# Patient Record
Sex: Female | Born: 1985 | Hispanic: No | Marital: Single | Smoking: Current every day smoker
Health system: Southern US, Community
[De-identification: ages and names within clinical notes are randomized; demographics above are authoritative.]

## PROBLEM LIST (undated history)

## (undated) DIAGNOSIS — E669 Obesity, unspecified: Secondary | ICD-10-CM

## (undated) DIAGNOSIS — Z8619 Personal history of other infectious and parasitic diseases: Secondary | ICD-10-CM

## (undated) DIAGNOSIS — F17200 Nicotine dependence, unspecified, uncomplicated: Secondary | ICD-10-CM

## (undated) DIAGNOSIS — A084 Viral intestinal infection, unspecified: Secondary | ICD-10-CM

## (undated) DIAGNOSIS — E66811 Obesity, class 1: Secondary | ICD-10-CM

## (undated) HISTORY — DX: Viral intestinal infection, unspecified: A08.4

## (undated) HISTORY — DX: Nicotine dependence, unspecified, uncomplicated: F17.200

## (undated) HISTORY — DX: Obesity, unspecified: E66.9

## (undated) HISTORY — DX: Obesity, class 1: E66.811

## (undated) HISTORY — DX: Personal history of other infectious and parasitic diseases: Z86.19

## (undated) HISTORY — PX: NO PAST SURGERIES: SHX2092

---

## 2005-01-21 ENCOUNTER — Other Ambulatory Visit: Admission: RE | Admit: 2005-01-21 | Discharge: 2005-01-21 | Payer: Self-pay | Admitting: Gynecology

## 2011-04-06 LAB — CBC AND DIFFERENTIAL: HEMOGLOBIN: 14.1 g/dL (ref 12.0–16.0)

## 2015-08-24 DIAGNOSIS — A084 Viral intestinal infection, unspecified: Secondary | ICD-10-CM

## 2015-08-24 HISTORY — DX: Viral intestinal infection, unspecified: A08.4

## 2015-09-16 ENCOUNTER — Encounter: Payer: Self-pay | Admitting: Emergency Medicine

## 2015-09-16 ENCOUNTER — Emergency Department: Payer: Managed Care, Other (non HMO)

## 2015-09-16 ENCOUNTER — Emergency Department
Admission: EM | Admit: 2015-09-16 | Discharge: 2015-09-16 | Disposition: A | Discharge status: Home / Self Care | Payer: Managed Care, Other (non HMO) | Attending: Emergency Medicine | Admitting: Emergency Medicine

## 2015-09-16 DIAGNOSIS — Z3202 Encounter for pregnancy test, result negative: Secondary | ICD-10-CM | POA: Diagnosis not present

## 2015-09-16 DIAGNOSIS — K219 Gastro-esophageal reflux disease without esophagitis: Secondary | ICD-10-CM | POA: Insufficient documentation

## 2015-09-16 DIAGNOSIS — K2970 Gastritis, unspecified, without bleeding: Secondary | ICD-10-CM | POA: Insufficient documentation

## 2015-09-16 DIAGNOSIS — R1013 Epigastric pain: Secondary | ICD-10-CM

## 2015-09-16 DIAGNOSIS — F17210 Nicotine dependence, cigarettes, uncomplicated: Secondary | ICD-10-CM | POA: Diagnosis not present

## 2015-09-16 LAB — COMPREHENSIVE METABOLIC PANEL
ALT: 18 U/L (ref 14–54)
ANION GAP: 10 (ref 5–15)
AST: 15 U/L (ref 15–41)
Albumin: 5.3 g/dL — ABNORMAL HIGH (ref 3.5–5.0)
Alkaline Phosphatase: 50 U/L (ref 38–126)
BUN: 8 mg/dL (ref 6–20)
CHLORIDE: 104 mmol/L (ref 101–111)
CO2: 24 mmol/L (ref 22–32)
Calcium: 9.5 mg/dL (ref 8.9–10.3)
Creatinine, Ser: 0.54 mg/dL (ref 0.44–1.00)
GFR calc non Af Amer: >60 mL/min (ref >60)
Glucose, Bld: 137 mg/dL — ABNORMAL HIGH (ref 65–99)
POTASSIUM: 3.5 mmol/L (ref 3.5–5.1)
SODIUM: 138 mmol/L (ref 135–145)
Total Bilirubin: 0.8 mg/dL (ref 0.3–1.2)
Total Protein: 8 g/dL (ref 6.5–8.1)

## 2015-09-16 LAB — URINALYSIS COMPLETE WITH MICROSCOPIC (ARMC ONLY)
Bacteria, UA: NONE SEEN
Bilirubin Urine: NEGATIVE
Glucose, UA: NEGATIVE mg/dL
HGB URINE DIPSTICK: NEGATIVE
Leukocytes, UA: NEGATIVE
Nitrite: NEGATIVE
PH: 6 (ref 5.0–8.0)
PROTEIN: NEGATIVE mg/dL
Specific Gravity, Urine: 1.009 (ref 1.005–1.030)

## 2015-09-16 LAB — CBC
HEMATOCRIT: 37.7 % (ref 35.0–47.0)
HEMOGLOBIN: 12.9 g/dL (ref 12.0–16.0)
MCH: 31.6 pg (ref 26.0–34.0)
MCHC: 34.1 g/dL (ref 32.0–36.0)
MCV: 92.5 fL (ref 80.0–100.0)
Platelets: 283 10*3/uL (ref 150–440)
RBC: 4.08 MIL/uL (ref 3.80–5.20)
RDW: 12.7 % (ref 11.5–14.5)
WBC: 9.2 10*3/uL (ref 3.6–11.0)

## 2015-09-16 LAB — POCT PREGNANCY, URINE: Preg Test, Ur: NEGATIVE

## 2015-09-16 LAB — LIPASE, BLOOD: LIPASE: 23 U/L (ref 11–51)

## 2015-09-16 MED ORDER — SUCRALFATE 1 G PO TABS: 1.0000 g | ORAL_TABLET | Freq: Four times a day (QID) | ORAL | Status: DC

## 2015-09-16 MED ORDER — GI COCKTAIL ~~LOC~~
30.0000 mL | Freq: Once | ORAL | Status: AC
Start: 2015-09-16 — End: 2015-09-16
  Administered 2015-09-16: 30 mL via ORAL
  Filled 2015-09-16 (×2): qty 30

## 2015-09-16 NOTE — ED Notes (Signed)
Pt returned from [facility]

## 2015-09-16 NOTE — ED Notes (Signed)
Patient transported to [facility] 

## 2015-09-16 NOTE — Discharge Instructions (Signed)
Please seek medical attention for any high fevers, chest pain, shortness of breath, change in behavior, persistent vomiting, bloody stool or any other new or concerning symptoms.   Gastritis, Adult Gastritis is soreness and puffiness (inflammation) of the lining of the stomach. If you do not get help, gastritis can cause bleeding and sores (ulcers) in the stomach. HOME CARE   Only take medicine as told by your doctor.  If you were given antibiotic medicines, take them as told. Finish the medicines even if you start to feel better.  Drink enough fluids to keep your pee (urine) clear or pale yellow.  Avoid foods and drinks that make your problems worse. Foods you may want to avoid include:  Caffeine or alcohol.  Chocolate.  Mint.  Garlic and onions.  Spicy foods.  Citrus fruits, including oranges, lemons, or limes.  Food containing tomatoes, including sauce, chili, salsa, and pizza.  Fried and fatty foods.  Eat small meals throughout the day instead of large meals. GET HELP RIGHT AWAY IF:   You have black or dark red poop (stools).  You throw up (vomit) blood. It may look like coffee grounds.  You cannot keep fluids down.  Your belly (abdominal) pain gets worse.  You have a fever.  You do not feel better after 1 week.  You have any other questions or concerns. MAKE SURE YOU:   Understand these instructions.  Will watch your condition.  Will get help right away if you are not doing well or get worse.   This information is not intended to replace advice given to you by your health care provider. Make sure you discuss any questions you have with your health care provider.   Document Released: 01/26/2008 Document Revised: 11/01/2011 Document Reviewed: 09/22/2011 Elsevier Interactive Patient Education 2016 [employer]. Food Choices for Gastroesophageal Reflux Disease, Adult When you have gastroesophageal reflux disease (GERD), the foods you eat and your eating  habits are very important. Choosing the right foods can help ease the discomfort of GERD. WHAT GENERAL GUIDELINES DO I NEED TO FOLLOW?  Choose fruits, vegetables, whole grains, low-fat dairy products, and low-fat meat, fish, and poultry.  Limit fats such as oils, salad dressings, butter, nuts, and avocado.  Keep a food diary to identify foods that cause symptoms.  Avoid foods that cause reflux. These may be different for different people.  Eat frequent small meals instead of three large meals each day.  Eat your meals slowly, in a relaxed setting.  Limit fried foods.  Cook foods using methods other than frying.  Avoid drinking alcohol.  Avoid drinking large amounts of liquids with your meals.  Avoid bending over or lying down until 2-3 hours after eating. WHAT FOODS ARE NOT RECOMMENDED? The following are some foods and drinks that may worsen your symptoms: Vegetables Tomatoes. Tomato juice. Tomato and spaghetti sauce. Chili peppers. Onion and garlic. Horseradish. Fruits Oranges, grapefruit, and lemon (fruit and juice). Meats High-fat meats, fish, and poultry. This includes hot dogs, ribs, ham, sausage, salami, and bacon. Dairy Whole milk and chocolate milk. Sour cream. Cream. Butter. Ice cream. Cream cheese.  Beverages Coffee and tea, with or without caffeine. Carbonated beverages or energy drinks. Condiments Hot sauce. Barbecue sauce.  Sweets/Desserts Chocolate and cocoa. Donuts. Peppermint and spearmint. Fats and Oils High-fat foods, including [place] fries and potato chips. Other Vinegar. Strong spices, such as black pepper, white pepper, red pepper, cayenne, curry powder, cloves, ginger, and chili powder. The items listed above may not be  a complete list of foods and beverages to avoid. Contact your dietitian for more information.   This information is not intended to replace advice given to you by your health care provider. Make sure you discuss any questions you  have with your health care provider.   Document Released: 08/09/2005 Document Revised: 08/30/2014 Document Reviewed: 06/13/2013 Elsevier Interactive Patient Education [employer]

## 2015-09-16 NOTE — ED Notes (Signed)
C/o upper abdominal pain.  Onset of symptoms 09/07/15.  Also c/o intermittent episodes of constipation and diarrhea.

## 2015-09-16 NOTE — ED Provider Notes (Signed)
[facility] Emergency Department Provider Note    ____________________________________________  Time seen: 1820  I have reviewed the triage vital signs and the nursing notes.   HISTORY  Chief Complaint Abdominal Pain   History limited by: Not Limited   HPI Melanie Burton is a 30 y.o. female with no significant past medical history presents to the emergency department today because of concerns for epigastric pain. Patient states that the pain started 9 days ago. She states she has had the pain every day since then. Is fairly constant. It does somewhat wax and wane. She says it is slightly worse after eating. She has not had any associated nausea or vomiting. No associated fever. She states that she went to an urgent care and was told she had GERD. She has been taking Zantac for the past 4 days. This is not provided any relief. She denies any similar symptoms in the past.     History reviewed. No pertinent past medical history.  There are no active problems to display for this patient.   History reviewed. No pertinent past surgical history.  No current outpatient prescriptions on file.  Allergies Review of patient's allergies indicates no known allergies.  No family history on file.  Social History Social History  Substance Use Topics  . Smoking status: Current Every Day Smoker -- 0.50 packs/day    Types: Cigarettes  . Smokeless tobacco: Never Used  . Alcohol Use: No    Review of Systems  Constitutional: Negative for fever. Cardiovascular: Negative for chest pain. Respiratory: Negative for shortness of breath. Gastrointestinal: Positive for epigastric pain Neurological: Negative for headaches, focal weakness or numbness.   10-point ROS otherwise negative.  ____________________________________________   PHYSICAL EXAM:  VITAL SIGNS: ED Triage Vitals  Enc Vitals Group     BP 09/16/15 1716 141/75 mmHg     Pulse Rate 09/16/15 1716  92     Resp 09/16/15 1716 16     Temp 09/16/15 1716 98.4 F (36.9 C)     Temp Source 09/16/15 1716 Oral     SpO2 09/16/15 1716 100 %     Weight 09/16/15 1716 185 lb (83.915 kg)     Height 09/16/15 1716  (1.626 m)     Head Cir --      Peak Flow --      Pain Score 09/16/15 1716 6   Constitutional: Alert and oriented. Well appearing and in no distress. Eyes: Conjunctivae are normal. PERRL. Normal extraocular movements. ENT   Head: Normocephalic and atraumatic.   Nose: No congestion/rhinnorhea.   Mouth/Throat: Mucous membranes are moist.   Neck: No stridor. Hematological/Lymphatic/Immunilogical: No cervical lymphadenopathy. Cardiovascular: Normal rate, regular rhythm.  No murmurs, rubs, or gallops. Respiratory: Normal respiratory effort without tachypnea nor retractions. Breath sounds are clear and equal bilaterally. No wheezes/rales/rhonchi. Gastrointestinal: Soft and minimally tender to palpation in the epigastric region. Genitourinary: Deferred Musculoskeletal: Normal range of motion in all extremities. No joint effusions.  No lower extremity tenderness nor edema. Neurologic:  Normal speech and language. No gross focal neurologic deficits are appreciated.  Skin:  Skin is warm, dry and intact. No rash noted. Psychiatric: Mood and affect are normal. Speech and behavior are normal. Patient exhibits appropriate insight and judgment.  ____________________________________________    LABS (pertinent positives/negatives)  Labs Reviewed  COMPREHENSIVE METABOLIC PANEL - Abnormal; Notable for the following:    Glucose, Bld 137 (*)    Albumin 5.3 (*)    All other components within normal  limits  URINALYSIS COMPLETEWITH MICROSCOPIC ([facility] ONLY) - Abnormal; Notable for the following:    Color, Urine STRAW (*)    APPearance CLEAR (*)    Ketones, ur 1+ (*)    Squamous Epithelial / LPF 0-5 (*)    All other components within normal limits  LIPASE, BLOOD  CBC  POC URINE  PREG, ED  POCT PREGNANCY, URINE     ____________________________________________   EKG  None  ____________________________________________    RADIOLOGY  [place] RUQ IMPRESSION: No cholelithiasis or sonographic evidence for acute cholecystitis.  Hepatic steatosis.  ____________________________________________   PROCEDURES  Procedure(s) performed: None  Critical Care performed: No  ____________________________________________   INITIAL IMPRESSION / ASSESSMENT AND PLAN / ED COURSE  Pertinent labs & imaging results that were available during my care of the patient were reviewed by me and considered in my medical decision making (see chart for details).  Patient presented to the emergency department today because of concerns for epigastric and upper abdominal pain. Blood work and right upper quadrant ultrasound without concerning findings. The patient was given a GI cocktail which she stated completely resolved the pain. The splinter for gastritis likely. Patient is already started Zantac so we will add on sucralfate.  ____________________________________________   FINAL CLINICAL IMPRESSION(S) / ED DIAGNOSES  Final diagnoses:  Epigastric abdominal pain  Gastritis     Phineas Semen, MD 09/16/15 2012

## 2015-12-18 ENCOUNTER — Encounter: Payer: Self-pay | Admitting: Family Medicine

## 2015-12-18 ENCOUNTER — Ambulatory Visit (INDEPENDENT_AMBULATORY_CARE_PROVIDER_SITE_OTHER): Payer: Managed Care, Other (non HMO) | Admitting: Family Medicine

## 2015-12-18 ENCOUNTER — Encounter (INDEPENDENT_AMBULATORY_CARE_PROVIDER_SITE_OTHER): Payer: Self-pay

## 2015-12-18 VITALS — BP 116/82 | HR 80 | Temp 99.0°F | Ht 64.0 in | Wt 190.5 lb

## 2015-12-18 DIAGNOSIS — Z720 Tobacco use: Secondary | ICD-10-CM | POA: Diagnosis not present

## 2015-12-18 DIAGNOSIS — F17200 Nicotine dependence, unspecified, uncomplicated: Secondary | ICD-10-CM

## 2015-12-18 DIAGNOSIS — E669 Obesity, unspecified: Secondary | ICD-10-CM | POA: Diagnosis not present

## 2015-12-18 DIAGNOSIS — E66811 Obesity, class 1: Secondary | ICD-10-CM | POA: Insufficient documentation

## 2015-12-18 NOTE — Progress Notes (Signed)
Pre visit review using our clinic review tool, if applicable. No additional management support is needed unless otherwise documented below in the visit note. 

## 2015-12-18 NOTE — Assessment & Plan Note (Signed)
Discussed healthy lifestyle changes to affect sustainable weight loss.  

## 2015-12-18 NOTE — Progress Notes (Signed)
BP 116/82 mmHg  Pulse 80  Temp(Src) 99 F (37.2 C) (Oral)  Ht  (1.626 m)  Wt 190 lb 8 oz (86.41 kg)  BMI 32.68 kg/m2  LMP 12/11/2015   CC: new pt to establish care  Subjective:    Patient ID: Lorenza Burton, female    DOB: 1986-07-12, 30 y.o.   MRN: [id number]  HPI: Audreana Hancox is a 30 y.o. female presenting on 12/18/2015 for Establish Care   Prior saw [facility].   Seen at ER 08/2015 with 1 wk epigastric pain dx viral gastroenteritis. Treated with GI cocktail and carafate. Also started zantac daily.   Smoker - 1/4 ppd. Trying to quit slowly. Obesity - clogs once weekly, walks around home.   Preventative: Declines CPE today. Well woman exam - 1-2 yrs ago with PCP. Tdap within last 5 yrs - will check at work.  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 Interim medical history since our last visit reviewed. Allergies and medications reviewed and updated. No current outpatient prescriptions on file prior to visit.   No current facility-administered medications on file prior to visit.    Review of Systems Per HPI unless specifically indicated in ROS section     Objective:    BP 116/82 mmHg  Pulse 80  Temp(Src) 99 F (37.2 C) (Oral)  Ht  (1.626 m)  Wt 190 lb 8 oz (86.41 kg)  BMI 32.68 kg/m2  LMP 12/11/2015  Wt Readings from Last 3 Encounters:  12/18/15 190 lb 8 oz (86.41 kg)  09/16/15 185 lb (83.915 kg)    Physical Exam  Constitutional: She appears well-developed and well-nourished. No distress.  HENT:  Mouth/Throat: Oropharynx is clear and moist. No oropharyngeal exudate.  Eyes: Conjunctivae and EOM are normal. Pupils are equal, round, and reactive to light. No scleral icterus.  Neck: No  thyromegaly present.  Cardiovascular: Normal rate, regular rhythm, normal heart sounds and intact distal pulses.   No murmur heard. Pulmonary/Chest: Effort normal and breath sounds normal. No respiratory distress. She has no wheezes. She has no rales.  Musculoskeletal: She exhibits no edema.  Skin: Skin is warm and dry. No rash noted.  Psychiatric: She has a normal mood and affect.  Nursing note and vitals reviewed.  Results for orders placed or performed during the hospital encounter of 09/16/15  Lipase, blood  Result Value Ref Range   Lipase 23 11 - 51 U/L  Comprehensive metabolic panel  Result Value Ref Range   Sodium 138 135 - 145 mmol/L   Potassium 3.5 3.5 - 5.1 mmol/L   Chloride 104 101 - 111 mmol/L   CO2 24 22 - 32 mmol/L   Glucose, Bld 137 (H) 65 - 99 mg/dL   BUN 8 6 - 20 mg/dL   Creatinine, Ser 4.09 0.44 - 1.00 mg/dL   Calcium 9.5 8.9 - 81.1 mg/dL   Total Protein 8.0 6.5 - 8.1 g/dL   Albumin 5.3 (H) 3.5 - 5.0 g/dL   AST 15 15 - 41 U/L   ALT 18 14 - 54 U/L   Alkaline Phosphatase 50 38 - 126 U/L   Total Bilirubin 0.8 0.3 - 1.2 mg/dL   GFR calc non Af Amer >60 >60 mL/min   GFR calc Af Amer >60 >60  mL/min   Anion gap 10 5 - 15  CBC  Result Value Ref Range   WBC 9.2 3.6 - 11.0 K/uL   RBC 4.08 3.80 - 5.20 MIL/uL   Hemoglobin 12.9 12.0 - 16.0 g/dL   HCT 09.837.7 11.935.0 - 14.747.0 %   MCV 92.5 80.0 - 100.0 fL   MCH 31.6 26.0 - 34.0 pg   MCHC 34.1 32.0 - 36.0 g/dL   RDW 82.912.7 56.211.5 - 13.014.5 %   Platelets 283 150 - 440 K/uL  Urinalysis complete, with microscopic ([facility] only)  Result Value Ref Range   Color, Urine STRAW (A) YELLOW   APPearance CLEAR (A) CLEAR   Glucose, UA NEGATIVE NEGATIVE mg/dL   Bilirubin Urine NEGATIVE NEGATIVE   Ketones, ur 1+ (A) NEGATIVE mg/dL   Specific Gravity, Urine 1.009 1.005 - 1.030   Hgb urine dipstick NEGATIVE NEGATIVE   pH 6.0 5.0 - 8.0   Protein, ur NEGATIVE NEGATIVE mg/dL   Nitrite NEGATIVE NEGATIVE   Leukocytes, UA NEGATIVE NEGATIVE   RBC /  HPF 0-5 0 - 5 RBC/hpf   WBC, UA 0-5 0 - 5 WBC/hpf   Bacteria, UA NONE SEEN NONE SEEN   Squamous Epithelial / LPF 0-5 (A) NONE SEEN   Mucous PRESENT   Pregnancy, urine POC  Result Value Ref Range   Preg Test, Ur NEGATIVE NEGATIVE      Assessment & Plan:   Problem List Items Addressed This Visit    Smoker - Primary    Slowly cutting down on her own. Encouraged full smoking cessation.      Obesity, Class I, BMI 30-34.9    Discussed healthy lifestyle changes to affect sustainable weight loss          Follow up plan: Return in about 1 year (around 12/17/2016), or as needed, for annual exam, prior fasting for blood work.  Eustaquio BoydenJavier Gretchen Weinfeld, MD

## 2015-12-18 NOTE — Patient Instructions (Addendum)
Double check at work on last Tdap. You are doing well today Return as needed or in 1 yr for physical.

## 2015-12-18 NOTE — Assessment & Plan Note (Signed)
Slowly cutting down on her own. Encouraged full smoking cessation.

## 2016-01-17 ENCOUNTER — Encounter: Payer: Self-pay | Admitting: Family Medicine

## 2016-07-14 ENCOUNTER — Ambulatory Visit (INDEPENDENT_AMBULATORY_CARE_PROVIDER_SITE_OTHER): Payer: Managed Care, Other (non HMO) | Admitting: Family Medicine

## 2016-07-14 VITALS — BP 110/88 | HR 78 | Temp 98.8°F | Ht 64.0 in | Wt 196.0 lb

## 2016-07-14 DIAGNOSIS — B309 Viral conjunctivitis, unspecified: Secondary | ICD-10-CM | POA: Diagnosis not present

## 2016-07-14 MED ORDER — ERYTHROMYCIN 5 MG/GM OP OINT: 1.0000 "application " | TOPICAL_OINTMENT | Freq: Three times a day (TID) | OPHTHALMIC | 0 refills | Status: AC

## 2016-07-14 NOTE — Progress Notes (Signed)
Subjective:    Patient ID: Melanie Burton, female    DOB: Aug 11, 1986, 30 y.o.   MRN: [id number]  HPI This is a 30 yo female who presents today with 2 days of right eye redness. Yesterday she used Visine and eye continued to get redder and have green drainage. By the end of the day, was more red and felt irritated like sand paper. Eye was matted shut this morning. Has noticed pain on right side of throat today. No visual changes. No nasal drainage, no cough, no ear pain. No fever. She works in a daycare, no recent strep exposure, kids with runny noses, some conjunctivitis last several weeks.   Past Medical History:  Diagnosis Date  . History of chicken pox   . Obesity, Class I, BMI 30-34.9   . Smoker   . Viral gastroenteritis 08/2015   [facility]   Past Surgical History:  Procedure Laterality Date  . NO PAST SURGERIES     Family History  Problem Relation Age of Onset  . Cancer Father [id number]    lung - deceased  . Cancer Paternal Grandfather [id number]    lung  . Cancer Paternal Aunt [id number]    breast  . Arthritis Other     paternal side  . Diabetes Other     maternal side  . CAD Neg Hx   . Stroke Neg Hx    Social History  Substance Use Topics  . Smoking status: Current Every Day Smoker    Packs/day: 0.25    Types: Cigarettes  . Smokeless tobacco: Never Used  . Alcohol use 0.0 oz/week     Comment: rare      Review of Systems Per HPI    Objective:   Physical Exam  Constitutional: She is oriented to person, place, and time. She appears well-developed and well-nourished. No distress.  HENT:  Head: Normocephalic and atraumatic.  Right Ear: External ear normal.  Left Ear: External ear normal.  Mouth/Throat: Oropharynx is clear and moist. No oropharyngeal exudate.  Eyes: EOM are normal. Pupils are equal, round, and reactive to light. Right eye exhibits no discharge. Left eye exhibits no discharge. Right conjunctiva is injected. Right conjunctiva has no hemorrhage. Left  conjunctiva is not injected. Left conjunctiva has no hemorrhage.  Cardiovascular: Normal rate.   Pulmonary/Chest: Effort normal.  Musculoskeletal: Normal range of motion.  Neurological: She is alert and oriented to person, place, and time.  Skin: Skin is warm and dry. She is not diaphoretic.  Psychiatric: She has a normal mood and affect. Her behavior is normal. Judgment and thought content normal.  Vitals reviewed.     BP 110/88   Pulse 78   Temp 98.8 F (37.1 C)   Ht 5\' 4"  (1.626 m)   Wt 196 lb (88.9 kg)   SpO2 98%   BMI 33.64 kg/m  Wt Readings from Last 3 Encounters:  07/14/16 196 lb (88.9 kg)  12/18/15 190 lb 8 oz (86.4 kg)  09/16/15 185 lb (83.9 kg)       Assessment & Plan:  1. Acute viral conjunctivitis of right eye - likely viral, provided written and verbal information regarding symptomatic treatment- suggested liquid tears for lubrication, good hand washing - if not resolved in 48 hours, can start antibiotic  - erythromycin ophthalmic ointment; Place 1 application into the right eye 3 (three) times daily. For 5-7 days  Dispense: 3.5 g; Refill: 0   Olean Reeeborah Piya Mesch, FNP-BC   [facility]  [place]  07/14/2016 10:31 AM

## 2016-07-14 NOTE — Patient Instructions (Addendum)
Viral Conjunctivitis, Adult Viral conjunctivitis is an inflammation of the clear membrane that covers the white part of your eye and the inner surface of your eyelid (conjunctiva). The inflammation is caused by a viral infection. The blood vessels in the conjunctiva become inflamed, causing the eye to become red or pink, and often itchy. Viral conjunctivitis can be easily passed from one person to another (is contagious). This condition is often called pink eye. What are the causes? This condition is caused by a virus. A virus is a type of contagious germ. It can be spread by touching objects that have been contaminated with the virus, such as doorknobs or towels. It can also be passed through droplets, such as from coughing or sneezing. What are the signs or symptoms? Symptoms of this condition include:  Eye redness.  Tearing or watery eyes.  Itchy and irritated eyes.  Burning feeling in the eyes.  Clear drainage from the eye.  Swollen eyelids.  A gritty feeling in the eye.  Light sensitivity. This condition often occurs with other symptoms, such as a fever, nausea, or a rash. How is this diagnosed? This condition is diagnosed with a medical history and physical exam. If you have discharge from your eye, the discharge may be tested to rule out other causes of conjunctivitis. How is this treated? Viral conjunctivitis does not respond to medicines that kill bacteria (antibiotics). Treatment for viral conjunctivitis is directed at stopping a bacterial infection from developing in addition to the viral infection. Treatment also aims to relieve your symptoms, such as itching. This may be done with antihistamine drops or other eye medicines. Rarely, steroid eye drops or antiviral medicines may be prescribed. Follow these instructions at home: Medicines    Take or apply over-the-counter and prescription medicines only as told by your health care provider.  Be very careful to avoid touching  the edge of the eyelid with the eye drop bottle or ointment tube when applying medicines to the affected eye. Being careful this way will stop you from spreading the infection to the other eye or to other people. Eye care   Avoid touching or rubbing your eyes.  Apply a warm, wet, clean washcloth to your eye for 10-20 minutes, 3-4 times per day or as told by your health care provider.  If you wear contact lenses, do not wear them until the inflammation is gone and your health care provider says it is safe to wear them again. Ask your health care provider how to sterilize or replace your contact lenses before using them again. Wear glasses until you can resume wearing contacts.  Avoid wearing eye makeup until the inflammation is gone. Throw away any old eye cosmetics that may be contaminated.  Gently wipe away any drainage from your eye with a warm, wet washcloth or a cotton ball. General instructions   Change or wash your pillowcase every day or as told by your health care provider.  Do not share towels, pillowcases, washcloths, eye makeup, makeup brushes, contact lenses, or glasses. This may spread the infection.  Wash your hands often with soap and water. Use paper towels to dry your hands. If soap and water are not available, use hand sanitizer.  Try to avoid contact with other people for one week or as told by your health care provider. Contact a health care provider if:  Your symptoms do not improve with treatment or they get worse.  You have increased pain.  Your vision becomes blurry.  You   have a fever.  You have facial pain, redness, or swelling.  You have yellow or green drainage coming from your eye.  You have new symptoms. This information is not intended to replace advice given to you by your health care provider. Make sure you discuss any questions you have with your health care provider. Document Released: 10/30/2002 Document Revised: 03/06/2016 Document Reviewed:  02/24/2016 Elsevier Interactive Patient Education  2017 [employer]  

## 2016-08-03 ENCOUNTER — Other Ambulatory Visit: Payer: Self-pay | Admitting: Family Medicine

## 2016-08-03 DIAGNOSIS — B309 Viral conjunctivitis, unspecified: Secondary | ICD-10-CM

## 2017-08-13 IMAGING — US US ABDOMEN LIMITED
1 series · 14 of 25 positions shown · non-contrast
Comparison: None.

CLINICAL DATA: Patient with epigastric abdominal pain.

EXAM:
US ABDOMEN LIMITED - RIGHT UPPER QUADRANT

[Series 1: us abdomen limited · 0.22mm/px · 14 of 68 slices shown]
[im 1/68]
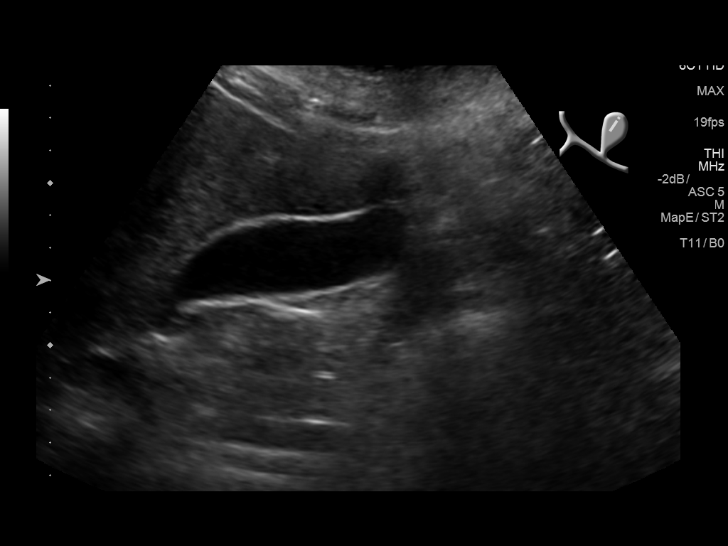
[im 6/68]
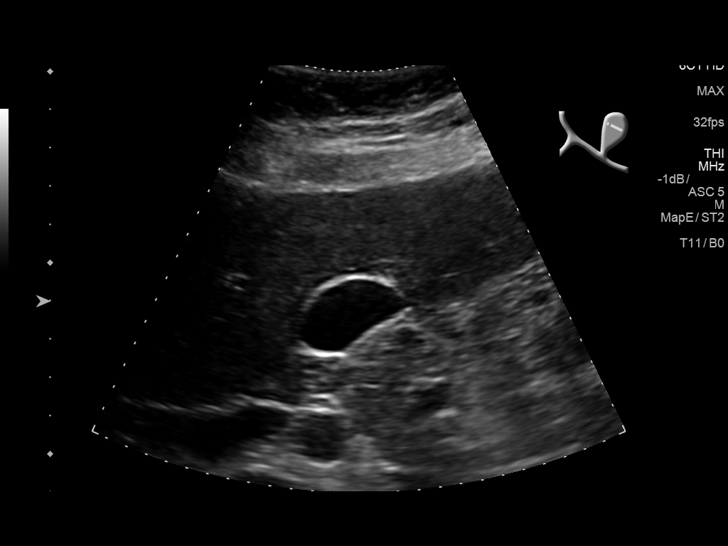
[im 12/68]
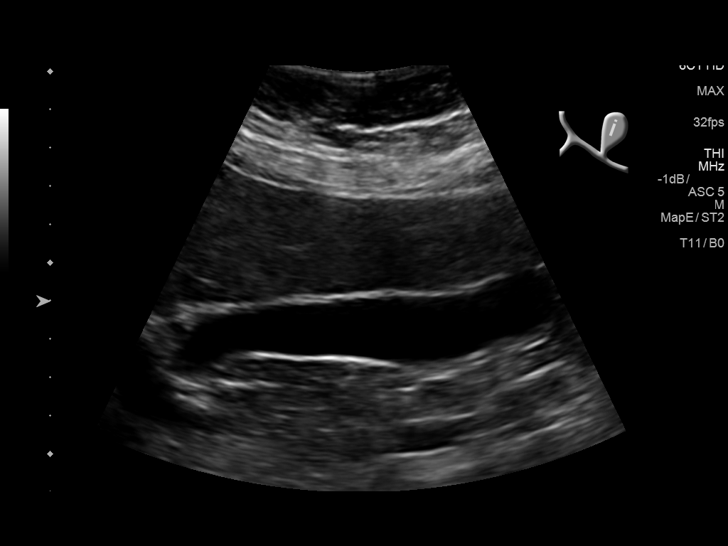
[im 17/68]
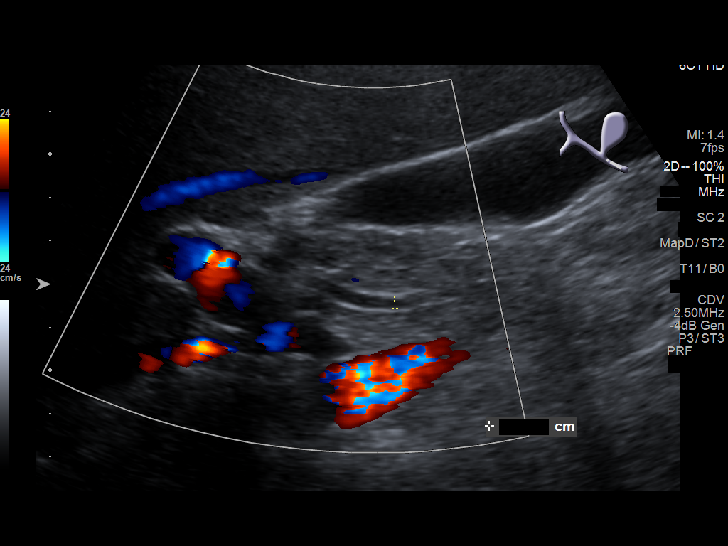
[im 23/68]
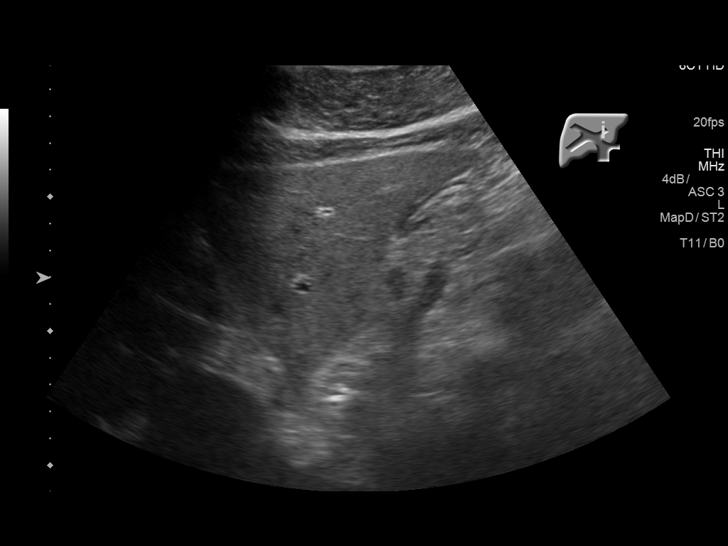
[im 26/68]
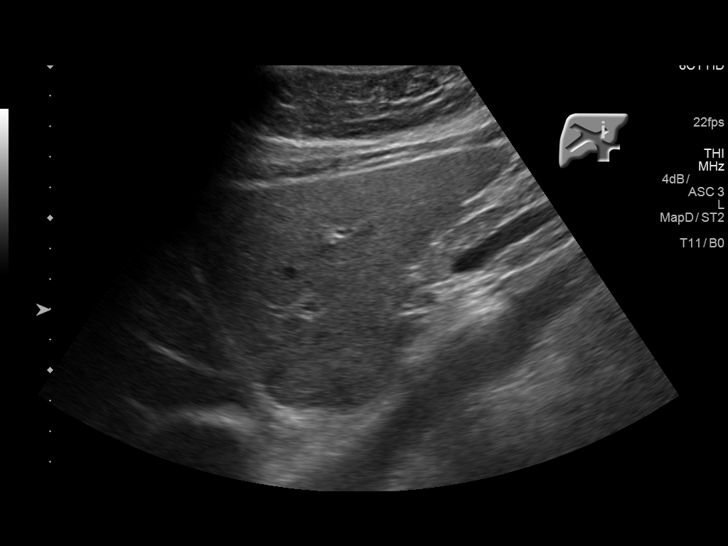
[im 31/68]
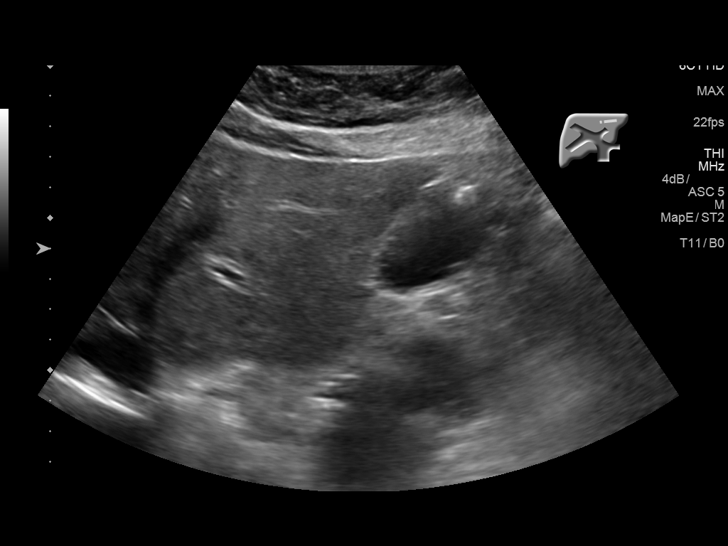
[im 37/68]
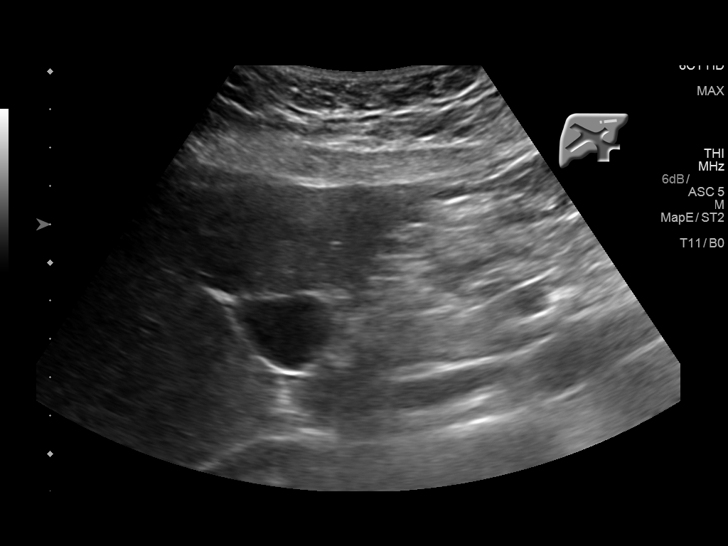
[im 42/68]
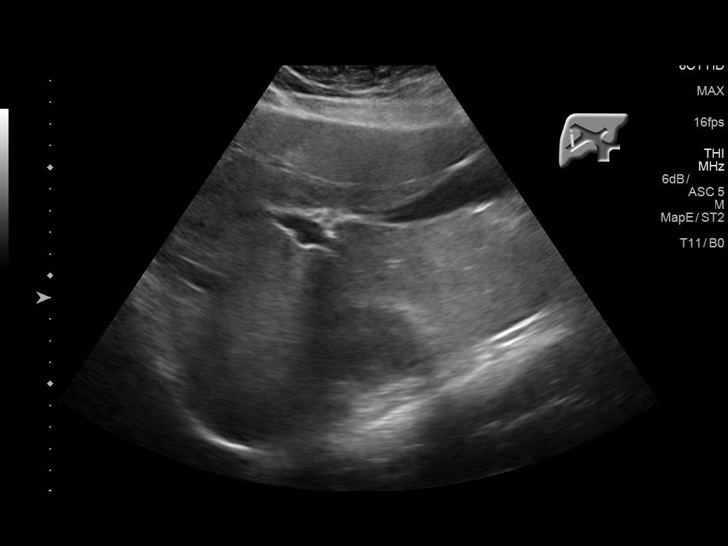
[im 45/68]
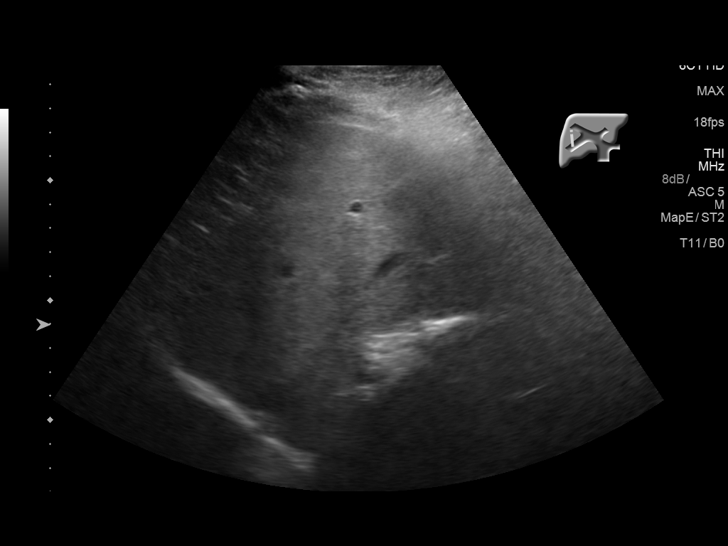
[im 51/68]
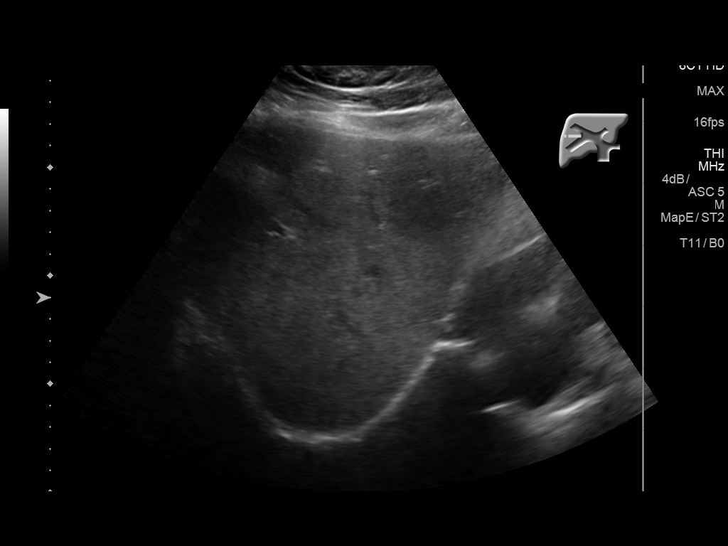
[im 56/68]
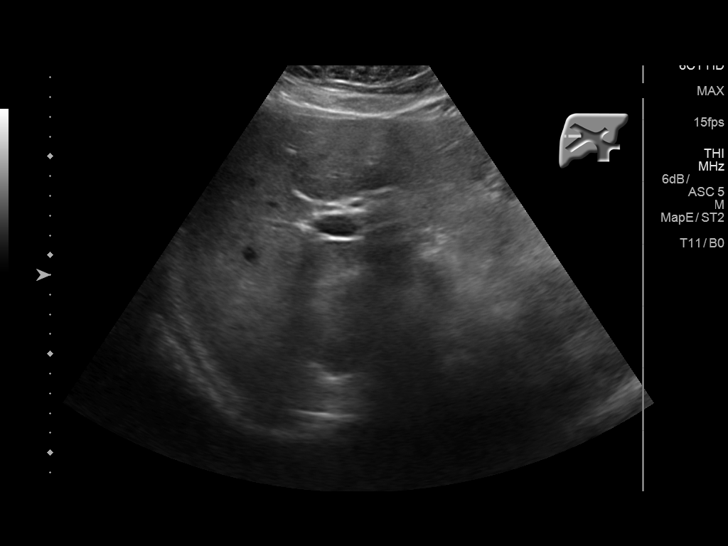
[im 62/68]
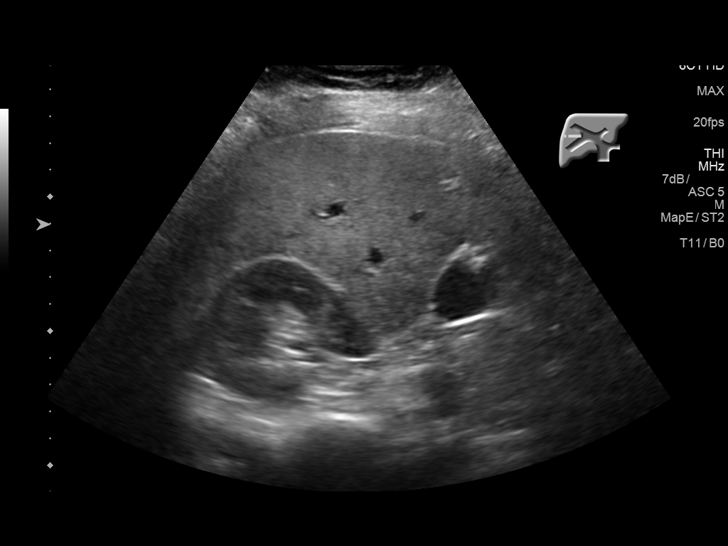
[im 68/68]
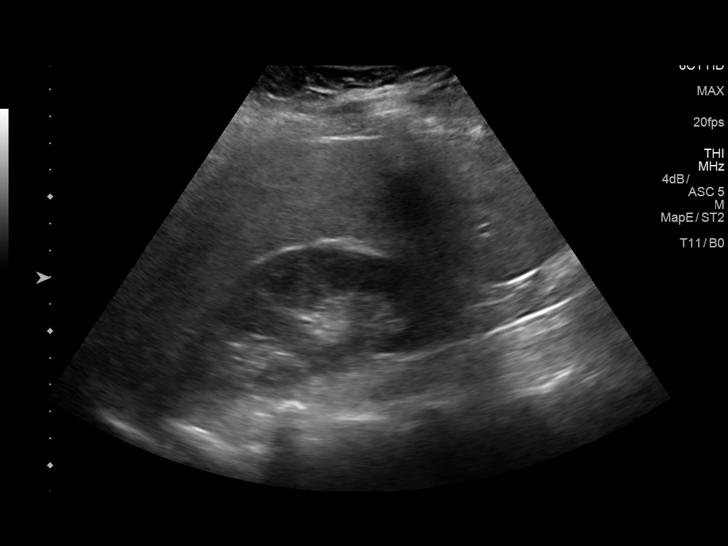

[14 of 25 positions shown; findings below may reference images not displayed]

FINDINGS: Gallbladder:

No gallstones or wall thickening visualized. No sonographic Murphy
sign noted by sonographer.

Common bile duct:

Diameter: 4 mm

Liver:

Diffusely increased in echogenicity.  No focal lesion identified.
IMPRESSION: No cholelithiasis or sonographic evidence for acute cholecystitis.

Hepatic steatosis.

## 2021-03-28 ENCOUNTER — Other Ambulatory Visit: Payer: Self-pay

## 2021-03-28 DIAGNOSIS — Z20822 Contact with and (suspected) exposure to covid-19: Secondary | ICD-10-CM | POA: Diagnosis not present

## 2021-03-28 DIAGNOSIS — N12 Tubulo-interstitial nephritis, not specified as acute or chronic: Secondary | ICD-10-CM | POA: Diagnosis not present

## 2021-03-28 DIAGNOSIS — F17210 Nicotine dependence, cigarettes, uncomplicated: Secondary | ICD-10-CM | POA: Insufficient documentation

## 2021-03-28 DIAGNOSIS — R109 Unspecified abdominal pain: Secondary | ICD-10-CM | POA: Diagnosis present

## 2021-03-28 LAB — BASIC METABOLIC PANEL
Anion gap: 11 (ref 5–15)
BUN: 6 mg/dL (ref 6–20)
CO2: 19 mmol/L — ABNORMAL LOW (ref 22–32)
Calcium: 8.7 mg/dL — ABNORMAL LOW (ref 8.9–10.3)
Chloride: 99 mmol/L (ref 98–111)
Creatinine, Ser: 0.62 mg/dL (ref 0.44–1.00)
GFR, Estimated: >60 mL/min (ref >60)
Glucose, Bld: 126 mg/dL — ABNORMAL HIGH (ref 70–99)
Potassium: 4.1 mmol/L (ref 3.5–5.1)
Sodium: 129 mmol/L — ABNORMAL LOW (ref 135–145)

## 2021-03-28 LAB — URINALYSIS, COMPLETE (UACMP) WITH MICROSCOPIC
Bilirubin Urine: NEGATIVE
Glucose, UA: NEGATIVE mg/dL
Ketones, ur: 20 mg/dL — AB
Nitrite: NEGATIVE
Protein, ur: >=300 mg/dL — AB
Specific Gravity, Urine: 1.029 (ref 1.005–1.030)
WBC, UA: >50 WBC/hpf — ABNORMAL HIGH (ref 0–5)
pH: 5 (ref 5.0–8.0)

## 2021-03-28 LAB — CBC
HCT: 33.4 % — ABNORMAL LOW (ref 36.0–46.0)
Hemoglobin: 12 g/dL (ref 12.0–15.0)
MCH: 32.6 pg (ref 26.0–34.0)
MCHC: 35.9 g/dL (ref 30.0–36.0)
MCV: 90.8 fL (ref 80.0–100.0)
Platelets: 207 10*3/uL (ref 150–400)
RBC: 3.68 MIL/uL — ABNORMAL LOW (ref 3.87–5.11)
RDW: 11.9 % (ref 11.5–15.5)
WBC: 10.9 10*3/uL — ABNORMAL HIGH (ref 4.0–10.5)
nRBC: 0 % (ref 0.0–0.2)

## 2021-03-28 MED ORDER — ACETAMINOPHEN 325 MG PO TABS
650.0000 mg | ORAL_TABLET | Freq: Once | ORAL | Status: AC
  Administered 2021-03-28: 650 mg via ORAL
  Filled 2021-03-28: qty 2

## 2021-03-28 NOTE — ED Triage Notes (Signed)
C/o left sided flank pain x3 days with chills, fever, and decreased appetite. Tylenol at 1200. Intermittent n/v. Denies diarrhea.

## 2021-03-28 NOTE — ED Notes (Addendum)
labs collected in triage

## 2021-03-29 ENCOUNTER — Emergency Department
Admission: EM | Admit: 2021-03-29 | Discharge: 2021-03-29 | Disposition: A | Discharge status: Home / Self Care | Payer: BC Managed Care – PPO | Attending: Emergency Medicine | Admitting: Emergency Medicine

## 2021-03-29 ENCOUNTER — Emergency Department: Payer: BC Managed Care – PPO

## 2021-03-29 DIAGNOSIS — N12 Tubulo-interstitial nephritis, not specified as acute or chronic: Secondary | ICD-10-CM

## 2021-03-29 LAB — PREGNANCY, URINE: Preg Test, Ur: NEGATIVE

## 2021-03-29 LAB — RESP PANEL BY RT-PCR (FLU A&B, COVID) ARPGX2
Influenza A by PCR: NEGATIVE
Influenza B by PCR: NEGATIVE
SARS Coronavirus 2 by RT PCR: POSITIVE — AB

## 2021-03-29 LAB — POC URINE PREG, ED: Preg Test, Ur: NEGATIVE

## 2021-03-29 MED ORDER — ONDANSETRON 4 MG PO TBDP: 4.0000 mg | ORAL_TABLET | Freq: Three times a day (TID) | ORAL | 0 refills | Status: AC | PRN

## 2021-03-29 MED ORDER — LEVOFLOXACIN 500 MG PO TABS: 500.0000 mg | ORAL_TABLET | Freq: Every day | ORAL | 0 refills | Status: AC

## 2021-03-29 MED ORDER — SODIUM CHLORIDE 0.9 % IV BOLUS
1000.0000 mL | Freq: Once | INTRAVENOUS | Status: AC
  Administered 2021-03-29: 1000 mL via INTRAVENOUS

## 2021-03-29 MED ORDER — KETOROLAC TROMETHAMINE 30 MG/ML IJ SOLN
30.0000 mg | Freq: Once | INTRAMUSCULAR | Status: AC
  Administered 2021-03-29: 30 mg via INTRAVENOUS
  Filled 2021-03-29: qty 1

## 2021-03-29 MED ORDER — SODIUM CHLORIDE 0.9 % IV SOLN
1.0000 g | Freq: Once | INTRAVENOUS | Status: AC
  Administered 2021-03-29: 1 g via INTRAVENOUS
  Filled 2021-03-29: qty 10

## 2021-03-29 MED ORDER — ONDANSETRON HCL 4 MG/2ML IJ SOLN
4.0000 mg | Freq: Once | INTRAMUSCULAR | Status: AC
  Administered 2021-03-29: 4 mg via INTRAVENOUS
  Filled 2021-03-29: qty 2

## 2021-03-29 NOTE — Discharge Instructions (Signed)
Please take antibiotics as prescribed for the next 10 days.  Please take nausea medication as needed, as written.  Return to the emergency department for any worsening abdominal/flank pain, if you are unable to tolerate your antibiotics due to nausea, or any other symptom personally concerning to yourself.

## 2021-03-29 NOTE — ED Notes (Signed)
Pt unable to sign d/t no signature pad

## 2021-03-29 NOTE — ED Notes (Signed)
Patient called back, advised covid + and to follow treatment/isolation guidelines

## 2021-03-29 NOTE — ED Provider Notes (Addendum)
[facility] Emergency Department Provider Note  Time seen: 4:45 AM  I have reviewed the triage vital signs and the nursing notes.   HISTORY  Chief Complaint Flank Pain (C/o left sided flank pain x3 days with chills, fever, and decreased appetite. Tylenol at 1200. )   HPI Melanie Burton is a 35 y.o. female with no significant past medical history presents to the emergency department for fever and left flank pain.  According to the patient starting 3 days ago she began with some pain to her left flank.  Starting the evening had a fever to 102.  States the pain has continued along with darker urine.  States last week she felt like she was getting a urinary tract infection and took Azo, symptoms went away until several days ago.  Patient has been nauseated with vomiting at home unable to keep down fluids today.  Fever as high as 105 yesterday per patient.  States mild left flank/back pain, dull.   Past Medical History:  Diagnosis Date   History of chicken pox    Obesity, Class I, BMI 30-34.9    Smoker    Viral gastroenteritis 08/2015   seen at [facility] ER    Patient Active Problem List   Diagnosis Date Noted   Smoker    Obesity, Class I, BMI 30-34.9     Past Surgical History:  Procedure Laterality Date   NO PAST SURGERIES      Prior to Admission medications   Medication Sig Start Date End Date Taking? Authorizing Provider  erythromycin ophthalmic ointment Place 1 application into the right eye 3 (three) times daily. For 5-7 days Patient not taking: Reported on 03/29/2021 07/14/16   Emi Belfast, FNP  ranitidine (ZANTAC) 150 MG tablet Take 150 mg by mouth daily. Patient not taking: Reported on 03/29/2021    [provider]    No Known Allergies  Family History  Problem Relation Age of Onset   Cancer Father 88       lung - deceased   Cancer Paternal Grandfather 27       lung   Cancer Paternal Aunt 33       breast   Arthritis Other         paternal side   Diabetes Other        maternal side   CAD Neg Hx    Stroke Neg Hx     Social History Social History   Tobacco Use   Smoking status: Every Day    Packs/day: 0.25    Types: Cigarettes   Smokeless tobacco: Never  Substance Use Topics   Alcohol use: Yes    Alcohol/week: 0.0 standard drinks    Comment: rare   Drug use: No    Review of Systems Constitutional: Intermittent fever x3 days Eyes: Negative for visual complaints ENT: Negative for recent illness/congestion Cardiovascular: Negative for chest pain. Respiratory: Negative for shortness of breath. Gastrointestinal: Mild to moderate left flank pain.  Nausea and vomiting. Genitourinary: Dysuria 1 week ago, darker urine. Musculoskeletal: Negative for musculoskeletal complaints Neurological: Negative for headache All other ROS negative  ____________________________________________   PHYSICAL EXAM:  VITAL SIGNS: ED Triage Vitals  Enc Vitals Group     BP 03/28/21 1930 140/73     Pulse Rate 03/28/21 1930 (!) 123     Resp 03/29/21 0113 16     Temp 03/28/21 1930 (!) 103.3 F (39.6 C)     Temp Source 03/28/21 1930 Oral  SpO2 03/28/21 1930 98 %     Weight --      Height 03/28/21 1931 5\' 4"  (1.626 m)     Head Circumference --      Peak Flow --      Pain Score 03/28/21 1931 7     Pain Loc --      Pain Edu? --      Excl. in GC? --    Constitutional: Alert and oriented. Well appearing and in no distress. Eyes: Normal exam ENT      Head: Normocephalic and atraumatic.      Mouth/Throat: Mucous membranes are moist. Cardiovascular: Normal rate, regular rhythm around 100 bpm. Respiratory: Normal respiratory effort without tachypnea nor retractions. Breath sounds are clear  Gastrointestinal: Soft and nontender. No distention.  Musculoskeletal: Nontender with normal range of motion in all extremities.  Neurologic:  Normal speech and language. No gross focal neurologic deficits Skin:  Skin is warm, dry  and intact.  Psychiatric: Mood and affect are normal.   ____________________________________________   RADIOLOGY  CT shows left-sided pyelonephritis otherwise negative  ____________________________________________   INITIAL IMPRESSION / ASSESSMENT AND PLAN / ED COURSE  Pertinent labs & imaging results that were available during my care of the patient were reviewed by me and considered in my medical decision making (see chart for details).   Patient presents to the emergency department for left flank pain dysuria no fever nausea or vomiting.  Patient does have mild left CVA tenderness to palpation.  Differential would include UTI, pyelonephritis, ureterolithiasis, less likely colitis or diverticulitis.  Lab work shows greater than 50 white blood cells within the urine consistent with possible urinary tract infection.  Slight hyponatremia mild leukocytosis.  We will start the patient on IV fluids, Zofran, Toradol, begin IV Rocephin and send urine culture.  Given the left flank pain we will also obtain a CT renal scan to rule out ureterolithiasis or other intra-abdominal pathology and to assess for an a sending infection such as pyelonephritis.  We will reassess after medications and fluids.  Patient CT scan and pregnancy test pending.  Patient care signed out to oncoming provider.  CT consistent pyelonephritis.  Patient states she is feeling much better after medications.  Strongly wishes to go home.  I believe it is reasonable for trial of outpatient antibiotics.  Given the likelihood of pyelonephritis we will start the patient on Levaquin, discharged with nausea medication have her follow-up with her doctor.   [name] was evaluated in Emergency Department on 03/29/2021 for the symptoms described in the history of present illness. She was evaluated in the context of the global COVID-19 pandemic, which necessitated consideration that the patient might be at risk for infection with the  SARS-CoV-2 virus that causes COVID-19. Institutional protocols and algorithms that pertain to the evaluation of patients at risk for COVID-19 are in a state of rapid change based on information released by regulatory bodies including the CDC and federal and state organizations. These policies and algorithms were followed during the patient's care in the ED.  ____________________________________________   FINAL CLINICAL IMPRESSION(S) / ED DIAGNOSES  Pyelonephritis    05/29/2021, MD 03/29/21 05/29/21    [id number], MD 03/29/21 [phone]

## 2021-03-29 NOTE — ED Notes (Addendum)
Pt. To CT

## 2021-03-29 NOTE — ED Notes (Signed)
+  covid result received from lab, attempt to call patient, message left to return call

## 2021-03-30 LAB — URINE CULTURE
